# Patient Record
Sex: Male | Born: 1945 | Race: Black or African American | Hispanic: No | Marital: Married | Smoking: Never smoker
Health system: Southern US, Community
[De-identification: ages and names within clinical notes are randomized; demographics above are authoritative.]

## PROBLEM LIST (undated history)

## (undated) DIAGNOSIS — N186 End stage renal disease: Secondary | ICD-10-CM

## (undated) DIAGNOSIS — E119 Type 2 diabetes mellitus without complications: Secondary | ICD-10-CM

## (undated) DIAGNOSIS — E785 Hyperlipidemia, unspecified: Secondary | ICD-10-CM

## (undated) DIAGNOSIS — G4730 Sleep apnea, unspecified: Secondary | ICD-10-CM

## (undated) DIAGNOSIS — M1990 Unspecified osteoarthritis, unspecified site: Secondary | ICD-10-CM

## (undated) DIAGNOSIS — I2510 Atherosclerotic heart disease of native coronary artery without angina pectoris: Secondary | ICD-10-CM

## (undated) DIAGNOSIS — I10 Essential (primary) hypertension: Secondary | ICD-10-CM

## (undated) DIAGNOSIS — Z951 Presence of aortocoronary bypass graft: Secondary | ICD-10-CM

## (undated) HISTORY — DX: Sleep apnea, unspecified: G47.30

## (undated) HISTORY — DX: Unspecified osteoarthritis, unspecified site: M19.90

## (undated) HISTORY — PX: OTHER SURGICAL HISTORY: SHX169

---

## 1993-05-27 HISTORY — PX: ROTATOR CUFF REPAIR: SHX139

## 2003-05-28 HISTORY — PX: PROSTATECTOMY: SHX69

## 2007-03-30 DIAGNOSIS — I2510 Atherosclerotic heart disease of native coronary artery without angina pectoris: Secondary | ICD-10-CM

## 2016-05-27 HISTORY — PX: CORONARY ARTERY BYPASS GRAFT: SHX141

## 2017-05-06 DIAGNOSIS — Z951 Presence of aortocoronary bypass graft: Secondary | ICD-10-CM

## 2018-07-21 ENCOUNTER — Encounter (HOSPITAL_COMMUNITY): Payer: Self-pay | Admitting: Internal Medicine

## 2018-07-21 ENCOUNTER — Observation Stay (HOSPITAL_COMMUNITY)
Admission: EM | Admit: 2018-07-21 | Discharge: 2018-07-22 | Disposition: A | Discharge status: Home / Self Care | Payer: Medicare Other | Attending: Internal Medicine | Admitting: Internal Medicine

## 2018-07-21 ENCOUNTER — Emergency Department (HOSPITAL_COMMUNITY): Payer: Medicare Other

## 2018-07-21 ENCOUNTER — Other Ambulatory Visit: Payer: Self-pay

## 2018-07-21 DIAGNOSIS — R079 Chest pain, unspecified: Secondary | ICD-10-CM

## 2018-07-21 DIAGNOSIS — Z79899 Other long term (current) drug therapy: Secondary | ICD-10-CM | POA: Diagnosis not present

## 2018-07-21 DIAGNOSIS — R0789 Other chest pain: Principal | ICD-10-CM | POA: Insufficient documentation

## 2018-07-21 DIAGNOSIS — Z7982 Long term (current) use of aspirin: Secondary | ICD-10-CM | POA: Diagnosis not present

## 2018-07-21 DIAGNOSIS — I120 Hypertensive chronic kidney disease with stage 5 chronic kidney disease or end stage renal disease: Secondary | ICD-10-CM | POA: Insufficient documentation

## 2018-07-21 DIAGNOSIS — E785 Hyperlipidemia, unspecified: Secondary | ICD-10-CM | POA: Insufficient documentation

## 2018-07-21 DIAGNOSIS — Z951 Presence of aortocoronary bypass graft: Secondary | ICD-10-CM | POA: Diagnosis not present

## 2018-07-21 DIAGNOSIS — Z794 Long term (current) use of insulin: Secondary | ICD-10-CM | POA: Diagnosis not present

## 2018-07-21 DIAGNOSIS — E119 Type 2 diabetes mellitus without complications: Secondary | ICD-10-CM | POA: Diagnosis not present

## 2018-07-21 DIAGNOSIS — I2510 Atherosclerotic heart disease of native coronary artery without angina pectoris: Secondary | ICD-10-CM | POA: Diagnosis not present

## 2018-07-21 DIAGNOSIS — N186 End stage renal disease: Secondary | ICD-10-CM | POA: Insufficient documentation

## 2018-07-21 DIAGNOSIS — Z992 Dependence on renal dialysis: Secondary | ICD-10-CM | POA: Diagnosis not present

## 2018-07-21 DIAGNOSIS — I10 Essential (primary) hypertension: Secondary | ICD-10-CM

## 2018-07-21 HISTORY — DX: End stage renal disease: N18.6

## 2018-07-21 HISTORY — DX: Presence of aortocoronary bypass graft: Z95.1

## 2018-07-21 HISTORY — DX: Atherosclerotic heart disease of native coronary artery without angina pectoris: I25.10

## 2018-07-21 HISTORY — DX: Essential (primary) hypertension: I10

## 2018-07-21 HISTORY — DX: Hyperlipidemia, unspecified: E78.5

## 2018-07-21 HISTORY — DX: Type 2 diabetes mellitus without complications: E11.9

## 2018-07-21 LAB — BASIC METABOLIC PANEL
Anion gap: 8 (ref 5–15)
BUN: 31 mg/dL — ABNORMAL HIGH (ref 8–23)
CO2: 27 mmol/L (ref 22–32)
Calcium: 9.1 mg/dL (ref 8.9–10.3)
Chloride: 103 mmol/L (ref 98–111)
Creatinine, Ser: 6.09 mg/dL — ABNORMAL HIGH (ref 0.61–1.24)
GFR calc Af Amer: 10 mL/min — ABNORMAL LOW (ref >60)
GFR calc non Af Amer: 8 mL/min — ABNORMAL LOW (ref >60)
Glucose, Bld: 101 mg/dL — ABNORMAL HIGH (ref 70–99)
Potassium: 4.4 mmol/L (ref 3.5–5.1)
Sodium: 138 mmol/L (ref 135–145)

## 2018-07-21 LAB — CBC
HCT: 36 % — ABNORMAL LOW (ref 39.0–52.0)
Hemoglobin: 11.2 g/dL — ABNORMAL LOW (ref 13.0–17.0)
MCH: 29.2 pg (ref 26.0–34.0)
MCHC: 31.1 g/dL (ref 30.0–36.0)
MCV: 94 fL (ref 80.0–100.0)
Platelets: 171 K/uL (ref 150–400)
RBC: 3.83 MIL/uL — ABNORMAL LOW (ref 4.22–5.81)
RDW: 14.7 % (ref 11.5–15.5)
WBC: 11.9 K/uL — ABNORMAL HIGH (ref 4.0–10.5)
nRBC: 0 % (ref 0.0–0.2)

## 2018-07-21 LAB — TROPONIN I: Troponin I: <0.03 ng/mL (ref <0.03)

## 2018-07-21 LAB — I-STAT TROPONIN, ED: Troponin i, poc: 0.03 ng/mL (ref 0.00–0.08)

## 2018-07-21 LAB — GLUCOSE, CAPILLARY: Glucose-Capillary: 115 mg/dL — ABNORMAL HIGH (ref 70–99)

## 2018-07-21 MED ORDER — INSULIN ASPART 100 UNIT/ML ~~LOC~~ SOLN: 0.0000 [IU] | Freq: Every day | SUBCUTANEOUS | Status: DC

## 2018-07-21 MED ORDER — ASPIRIN 81 MG PO CHEW
81.0000 mg | CHEWABLE_TABLET | Freq: Every day | ORAL | Status: DC
  Administered 2018-07-22: 81 mg via ORAL
  Filled 2018-07-21: qty 1

## 2018-07-21 MED ORDER — INSULIN ASPART 100 UNIT/ML ~~LOC~~ SOLN
0.0000 [IU] | Freq: Three times a day (TID) | SUBCUTANEOUS | Status: DC
  Administered 2018-07-22: 8 [IU] via SUBCUTANEOUS

## 2018-07-21 MED ORDER — CLOPIDOGREL BISULFATE 75 MG PO TABS
75.0000 mg | ORAL_TABLET | Freq: Every day | ORAL | Status: DC
  Administered 2018-07-22: 75 mg via ORAL
  Filled 2018-07-21: qty 1

## 2018-07-21 MED ORDER — ALUM & MAG HYDROXIDE-SIMETH 200-200-20 MG/5ML PO SUSP: 30.0000 mL | Freq: Once | ORAL | Status: DC

## 2018-07-21 MED ORDER — VITAMIN D (ERGOCALCIFEROL) 1.25 MG (50000 UNIT) PO CAPS: 50000.0000 [IU] | ORAL_CAPSULE | ORAL | Status: DC

## 2018-07-21 MED ORDER — LIDOCAINE VISCOUS HCL 2 % MT SOLN: 15.0000 mL | Freq: Once | OROMUCOSAL | Status: DC

## 2018-07-21 MED ORDER — ONDANSETRON HCL 4 MG/2ML IJ SOLN: 4.0000 mg | Freq: Four times a day (QID) | INTRAMUSCULAR | Status: DC | PRN

## 2018-07-21 MED ORDER — HEPARIN SODIUM (PORCINE) 5000 UNIT/ML IJ SOLN
5000.0000 [IU] | Freq: Three times a day (TID) | INTRAMUSCULAR | Status: DC
  Administered 2018-07-21 – 2018-07-22 (×2): 5000 [IU] via SUBCUTANEOUS
  Filled 2018-07-21 (×2): qty 1

## 2018-07-21 MED ORDER — AMLODIPINE BESYLATE 5 MG PO TABS
5.0000 mg | ORAL_TABLET | Freq: Every day | ORAL | Status: DC
  Administered 2018-07-22: 5 mg via ORAL
  Filled 2018-07-21: qty 1

## 2018-07-21 MED ORDER — LIDOCAINE-PRILOCAINE 2.5-2.5 % EX CREA: 1.0000 "application " | TOPICAL_CREAM | CUTANEOUS | Status: DC | PRN

## 2018-07-21 MED ORDER — ADULT MULTIVITAMIN W/MINERALS CH
1.0000 | ORAL_TABLET | Freq: Every day | ORAL | Status: DC
  Administered 2018-07-22: 1 via ORAL
  Filled 2018-07-21: qty 1

## 2018-07-21 MED ORDER — ACETAMINOPHEN 325 MG PO TABS: 650.0000 mg | ORAL_TABLET | Freq: Four times a day (QID) | ORAL | Status: DC | PRN

## 2018-07-21 MED ORDER — INSULIN DETEMIR 100 UNIT/ML FLEXPEN: 2.0000 [IU] | PEN_INJECTOR | SUBCUTANEOUS | Status: DC

## 2018-07-21 MED ORDER — METOPROLOL TARTRATE 25 MG PO TABS
25.0000 mg | ORAL_TABLET | Freq: Two times a day (BID) | ORAL | Status: DC
  Administered 2018-07-21 – 2018-07-22 (×2): 25 mg via ORAL
  Filled 2018-07-21 (×2): qty 1

## 2018-07-21 MED ORDER — PANTOPRAZOLE SODIUM 40 MG PO TBEC
40.0000 mg | DELAYED_RELEASE_TABLET | Freq: Every day | ORAL | Status: DC
  Administered 2018-07-22: 40 mg via ORAL
  Filled 2018-07-21: qty 1

## 2018-07-21 MED ORDER — CENTRUM PO CHEW: 1.0000 | CHEWABLE_TABLET | Freq: Every day | ORAL | Status: DC

## 2018-07-21 MED ORDER — INSULIN GLARGINE 100 UNIT/ML ~~LOC~~ SOLN
20.0000 [IU] | Freq: Every day | SUBCUTANEOUS | Status: DC
  Administered 2018-07-21: 20 [IU] via SUBCUTANEOUS
  Filled 2018-07-21: qty 0.2

## 2018-07-21 MED ORDER — INSULIN GLARGINE 100 UNIT/ML SOLOSTAR PEN: 20.0000 [IU] | PEN_INJECTOR | Freq: Every day | SUBCUTANEOUS | Status: DC

## 2018-07-21 MED ORDER — ATORVASTATIN CALCIUM 80 MG PO TABS
80.0000 mg | ORAL_TABLET | Freq: Every day | ORAL | Status: DC
  Administered 2018-07-21: 80 mg via ORAL
  Filled 2018-07-21: qty 1

## 2018-07-21 MED ORDER — ACETAMINOPHEN 325 MG PO TABS: 650.0000 mg | ORAL_TABLET | ORAL | Status: DC | PRN

## 2018-07-21 NOTE — ED Provider Notes (Signed)
[facility] EMERGENCY DEPARTMENT Provider Note   CSN: [id number] Arrival date & time: 07/21/18  1537    History   Chief Complaint Chief Complaint  Patient presents with  . Chest Pain    HPI [place] is a 73 y.o. male with history of CAD (bypass 04/2017), ESRD dialysis (M, W, F), hypertension, diabetes, hyperlipidemia presenting today for chest pain.  Patient reports that him and his wife were driving to [place] from [place] when he had onset of left-sided chest pressure at 1:15 PM.  Patient describes his pain as a burning pressure that has been intermittent since time of onset lasting a few seconds at a time and occurring multiple times a minute.  Patient states that his pain has decreased since time of onset however still persists at time of evaluation.  Patient takes Plavix and 81 mg aspirin daily.  He was brought in by EMS who provided additional 324 of aspirin, no nitro given.  EMS concern for T wave inversions at lead I and aVL.    HPI  Past Medical History:  Diagnosis Date  . CAD (coronary artery disease)   . DM2 (diabetes mellitus, type 2) ([facility])   . ESRD (end stage renal disease) ([facility])   . HLD (hyperlipidemia)   . HTN (hypertension)   . Hx of CABG     Patient Active Problem List   Diagnosis Date Noted  . HTN (hypertension) 07/21/2018  . ESRD (end stage renal disease) ([facility]) 07/21/2018  . DM type 2 (diabetes mellitus, type 2) ([facility]) 07/21/2018  . Hyperlipidemia 07/21/2018  . Chest pain 07/21/2018  . 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 Coronary artery disease involving native coronary artery of native heart without angina pectoris 03/30/2007    Past Surgical History:  Procedure Laterality Date  . CORONARY ARTERY BYPASS GRAFT  2018  . PROSTATECTOMY  2005  . ROTATOR CUFF REPAIR Left 1995        Home Medications    Prior to Admission medications   Medication Sig Start Date End Date Taking? Authorizing Provider  acetaminophen (TYLENOL) 325  MG tablet Take 650 mg by mouth every 6 (six) hours as needed for pain. 05/13/17  Yes [provider]  amLODipine (NORVASC) 5 MG tablet Take 5 mg by mouth daily.   Yes [provider]  aspirin 81 MG chewable tablet Chew 81 mg by mouth daily.   Yes [provider]  atorvastatin (LIPITOR) 80 MG tablet Take 80 mg by mouth at bedtime.   Yes [provider]  clopidogrel (PLAVIX) 75 MG tablet Take 75 mg by mouth daily.   Yes [provider]  Insulin Detemir (LEVEMIR FLEXTOUCH) 100 UNIT/ML Pen Inject 2-7 Units into the skin See admin instructions. Sliding scale.   Yes [provider]  Insulin Glargine (LANTUS SOLOSTAR) 100 UNIT/ML Solostar Pen Inject 20 Units into the skin at bedtime.   Yes [provider]  lidocaine-prilocaine (EMLA) cream Apply 1 application topically as needed (dialysis).   Yes [provider]  metoprolol tartrate (LOPRESSOR) 25 MG tablet Take 25 mg by mouth 2 (two) times daily.   Yes [provider]  multivitamin-iron-minerals-folic acid (CENTRUM) chewable tablet Chew 1 tablet by mouth daily.   Yes [provider]  pantoprazole (PROTONIX) 40 MG tablet Take 40 mg by mouth daily.   Yes [provider]  ergocalciferol (VITAMIN D2) 1.25 MG (50000 UT) capsule Take 50,000 Units by mouth once a week.    [provider]    Family History No family history on file.  Social History Social History   Tobacco Use  . Smoking status: Never Smoker  . Smokeless tobacco: Never Used  Substance Use Topics  . Alcohol use: Never    Frequency: Never  . Drug use: Never     Allergies   Pork-derived products; Tomato; and Penicillins   Review of Systems Review of Systems  Constitutional: Negative.  Negative for chills and fever.  Respiratory: Negative.  Negative for shortness of breath.   Cardiovascular: Positive for chest pain. Negative for leg swelling.  Gastrointestinal: Negative.   Negative for abdominal pain, nausea and vomiting.  Musculoskeletal: Negative.  Negative for back pain and neck pain.  Neurological: Negative.  Negative for dizziness, syncope, weakness and headaches.  All other systems reviewed and are negative.  Physical Exam Updated Vital Signs BP (!) 140/59   Pulse 71   Temp 98.5 F (36.9 C) (Oral)   Resp 12   Ht 5\' 8"  (1.727 m)   Wt 108.9 kg   SpO2 95%   BMI 36.49 kg/m   Physical Exam Constitutional:      General: He is not in acute distress.    Appearance: Normal appearance. He is well-developed. He is obese. He is not ill-appearing or diaphoretic.  HENT:     Head: Normocephalic and atraumatic.     Right Ear: External ear normal.     Left Ear: External ear normal.     Nose: Nose normal.  Eyes:     General: Vision grossly intact. Gaze aligned appropriately.     Pupils: Pupils are equal, round, and reactive to light.  Neck:     Musculoskeletal: Normal range of motion.     Trachea: Trachea and phonation normal. No tracheal deviation.  Cardiovascular:     Rate and Rhythm: Normal rate and regular rhythm.     Pulses:          Dorsalis pedis pulses are 1+ on the right side and 1+ on the left side.       Posterior tibial pulses are 1+ on the right side and 1+ on the left side.     Heart sounds: Normal heart sounds.  Pulmonary:     Effort: Pulmonary effort is normal. No respiratory distress.     Breath sounds: Normal breath sounds.  Chest:     Chest wall: No deformity, tenderness or crepitus.  Abdominal:     General: There is no distension.     Palpations: Abdomen is soft.     Tenderness: There is no abdominal tenderness. There is no guarding or rebound.  Musculoskeletal: Normal range of motion.     Right lower leg: He exhibits no tenderness.     Left lower leg: He exhibits no tenderness.  Skin:    General: Skin is warm and dry.     Capillary Refill: Capillary refill takes less than 2 seconds.  Neurological:     Mental Status: He is  alert.     GCS: GCS eye subscore is 4. GCS verbal subscore is 5. GCS motor subscore is 6.     Comments: Speech is clear and goal oriented, follows commands Major Cranial nerves without deficit, no facial droop Moves extremities without ataxia, coordination intact  Psychiatric:        Behavior: Behavior normal.    ED Treatments / Results  Labs (all labs ordered are listed, but only abnormal results are displayed) Labs Reviewed  BASIC METABOLIC PANEL -  Abnormal; Notable for the following components:      Result Value   Glucose, Bld 101 (*)    BUN 31 (*)    Creatinine, Ser 6.09 (*)    GFR calc non Af Amer 8 (*)    GFR calc Af Amer 10 (*)    All other components within normal limits  CBC - Abnormal; Notable for the following components:   WBC 11.9 (*)    RBC 3.83 (*)    Hemoglobin 11.2 (*)    HCT 36.0 (*)    All other components within normal limits  TROPONIN I  TROPONIN I  BASIC METABOLIC PANEL  PHOSPHORUS  I-STAT TROPONIN, ED    EKG EKG Interpretation  Date/Time:  Tuesday July 21 2018 15:47:46 EST Ventricular Rate:  66 PR Interval:    QRS Duration: 91 QT Interval:  377 QTC Calculation: 395 R Axis:   51 Text Interpretation:  Sinus rhythm Abnormal T, consider ischemia, lateral leads downsloping st segments in I, avL and V6 No old tracing to compare Confirmed by Deno Etienne [phone]) on 07/21/2018 3:49:50 PM   Radiology Dg Chest Port 1 View  Result Date: 07/21/2018 CLINICAL DATA:  Initial evaluation for acute chest pain. EXAM: PORTABLE CHEST 1 VIEW COMPARISON:  None available. FINDINGS: Median sternotomy wires underlying surgical clips. Coronary stent noted. Underlying cardiomegaly. Mediastinal silhouette normal. Lungs normally inflated. Blunting of the left costophrenic angle could reflect pleural effusion versus chronic pleural reaction/scarring. No focal infiltrates. No pulmonary edema. No pneumothorax. No acute osseous finding. IMPRESSION: 1. Blunting of the left  costophrenic angle, which could reflect small left pleural effusion versus chronic pleural reaction/scarring. 2. No other active cardiopulmonary disease. 3. Cardiomegaly without pulmonary edema. Electronically Signed   By: Jeannine Boga M.D.   On: 07/21/2018 16:37    Procedures Procedures (including critical care time)  Medications Ordered in ED Medications  acetaminophen (TYLENOL) tablet 650 mg (has no administration in time range)  aspirin chewable tablet 81 mg (has no administration in time range)  amLODipine (NORVASC) tablet 5 mg (has no administration in time range)  atorvastatin (LIPITOR) tablet 80 mg (has no administration in time range)  metoprolol tartrate (LOPRESSOR) tablet 25 mg (has no administration in time range)  Insulin Detemir (LEVEMIR) FlexPen 2-7 Units (has no administration in time range)  Insulin Glargine (LANTUS) Solostar Pen 20 Units (has no administration in time range)  pantoprazole (PROTONIX) EC tablet 40 mg (has no administration in time range)  clopidogrel (PLAVIX) tablet 75 mg (has no administration in time range)  ergocalciferol (VITAMIN D2) capsule 50,000 Units (has no administration in time range)  multivitamin-iron-minerals-folic acid (CENTRUM) chewable tablet 1 tablet (has no administration in time range)  lidocaine-prilocaine (EMLA) cream 1 application (has no administration in time range)  acetaminophen (TYLENOL) tablet 650 mg (has no administration in time range)  ondansetron (ZOFRAN) injection 4 mg (has no administration in time range)  heparin injection 5,000 Units (has no administration in time range)  alum & mag hydroxide-simeth (MAALOX/MYLANTA) 200-200-20 MG/5ML suspension 30 mL (has no administration in time range)    And  lidocaine (XYLOCAINE) 2 % viscous mouth solution 15 mL (has no administration in time range)  insulin aspart (novoLOG) injection 0-15 Units (has no administration in time range)  insulin aspart (novoLOG) injection 0-5  Units (has no administration in time range)     Initial Impression / Assessment and Plan / ED Course  I have reviewed the triage vital signs and the nursing notes.  Pertinent labs & imaging results that were available during my care of the patient were reviewed by me and considered in my medical decision making (see chart for details).  Clinical Course as of Jul 21 2008  Tue Jul 21, 2018  1708 Discussed with hospitalist; to see patient.   [BM]    Clinical Course User Index [BM] Deliah Boston, PA-C      73 year old male with multiple comorbidities presenting today for chest pain.  Patient is on Plavix daily and has received aspirin prior to arrival.  Initial troponin negative CBC with leukocytosis of 11.9 BMP with elevated creatinine, dialysis patient EKG as above reviewed with Dr. Tyrone Nine Chest x-ray:  IMPRESSION: 1. Blunting of the left costophrenic angle, which could reflect small left pleural effusion versus chronic pleural reaction/scarring. 2. No other active cardiopulmonary disease. 3. Cardiomegaly without pulmonary edema. - Patient seen and evaluated by Dr. Tyrone Nine.  Plan at this time is to admit for cardiac rule out. - Patient reevaluated states understanding of care plan and is agreeable for admission.  He is well-appearing and in no acute distress states improvement of his pain since onset. - Patient has been admitted to hospital service for further evaluation and management.  Note: Portions of this report may have been transcribed using voice recognition software. Every effort was made to ensure accuracy; however, inadvertent computerized transcription errors may still be present. Final Clinical Impressions(s) / ED Diagnoses   Final diagnoses:  Chest pain, unspecified type    ED Discharge Orders    None       Gari Crown 07/21/18 2011    Floyd, Dan, DO 07/21/18 2247

## 2018-07-21 NOTE — H&P (Signed)
History and Physical    William Cochran [id number] DOB: [birth year] DOA: 07/21/2018  PCP: William Pain, FNP Consultants: None Patient coming from: Home- lives with wife and son in [place].  Chief Complaint: Chest Cochran  HPI: William Cochran is a 73 y.o. male with medical history significant for CAD status post CABG in 2018 in [place] (Dr. Amanda Pea, MD was cardiologist, Dr. Justice Cochran cardiovascular surgeon), hypertension, hyperlipidemia, diabetes mellitus type 2, insulin-dependent ESRD on HD MWF who presented to the ED today with c/o chest Cochran that occurred while he was driving from his home in [place] to his daughter's nephrology appointment in [place] earlier today.  Around 115 he had the acute onset of 4 out of 10 left-sided chest Cochran.  He describes the Cochran as dull and aggravating, no radiation, no associated shortness of breath diaphoresis or dizziness.  He states that he has this Cochran fairly regularly about once or twice a month and episodes usually last a few seconds before they go away.  Today he has had these episodes about every 30 minutes.  When asked why he came into the ED he stated it is because his wife made him.  Otherwise he is felt well except for some nausea and posttussive emesis this morning.  He has had a cold for about a week with some sinus congestion and cough productive of white sputum.  He states that his symptoms are improving.  He is had no fevers or chills.  No other flulike symptoms.  Chest Cochran is nonpleuritic.  He is a never smoker.  He does not drink alcohol.  Patient has not seen a cardiologist since his CABG in 2018.  He states that he went in to have a stent placement and during the cath was told there was too much disease and he would have to have bypass surgery.  He has longstanding insulin-dependent diabetes.  His PCP is [place], Design[employer], [employer]ellery.  He goes to [facility], dialyzes Monday Wednesday Friday  via left upper extremity AV fistula.  His last dialysis was yesterday.  ED Course: Initial troponin was negative.  Creatinine was 6.9 (ESRD), Otherwise labs were unremarkable.  Chest x-ray showed nothing acute.   EKG showed some downsloping ST segments in leads I, aVL and 6.  There is no old EKG to compare.  Review of Systems: As per HPI; otherwise review of systems reviewed and negative.   Ambulatory Status:  Ambulates without assistance  Past Medical History:  Diagnosis Date  . CAD (coronary artery disease)   . DM2 (diabetes mellitus, type 2) ([place])   . ESRD (end stage renal disease) ([facility])   . HLD (hyperlipidemia)   . HTN (hypertension)   . Hx of CABG     Past Surgical History:  Procedure Laterality Date  . CORONARY ARTERY BYPASS GRAFT  2018  . PROSTATECTOMY  2005  . ROTATOR CUFF REPAIR Left 1995    Social History   Socioeconomic History  . Marital status: Married    Spouse name: [place]  . Number of children: 4  . Years of education: Not on file  . Highest education level: Not on file  Occupational History  . Occupation: retired    Comment: Building control surveyor  Social Needs  . Financial resource strain: Not on file  . Food insecurity:    Worry: Not on file    Inability: Not on file  . Transportation needs:    Medical: Not on file  Non-medical: Not on file  Tobacco Use  . Smoking status: Never Smoker  . Smokeless tobacco: Never Used  Substance and Sexual Activity  . Alcohol use: Never    Frequency: Never  . Drug use: Never  . Sexual activity: Yes    Partners: Female  Lifestyle  . Physical activity:    Days per week: Not on file    Minutes per session: Not on file  . Stress: Not on file  Relationships  . Social connections:    Talks on phone: Not on file    Gets together: Not on file    Attends religious service: Not on file    Active member of club or organization: Not on file    Attends meetings of clubs or organizations: Not on file     Relationship status: Not on file  . Intimate partner violence:    Fear of current or ex partner: Not on file    Emotionally abused: Not on file    Physically abused: Not on file    Forced sexual activity: Not on file  Other Topics Concern  . Not on file  Social History Narrative  . Not on file    Allergies  Allergen Reactions  . Pork-Derived Products Other (See Comments)    religious beliefs   . Tomato Other (See Comments)    Acid reflux  . Penicillins Rash    Did it involve swelling of the face/tongue/throat, SOB, or low BP? Yes Did it involve sudden or severe rash/hives, skin peeling, or any reaction on the inside of your mouth or nose? Yes Did you need to seek medical attention at a hospital or doctor's office? No When did it last happen?pt was a child If all above answers are "NO", may proceed with cephalosporin use.     No family history on file.  Prior to Admission medications   Medication Sig Start Date End Date Taking? Authorizing Provider  acetaminophen (TYLENOL) 325 MG tablet Take 650 mg by mouth every 6 (six) hours as needed for Cochran. 05/13/17  Yes [provider]  amLODipine (NORVASC) 5 MG tablet Take 5 mg by mouth daily.   Yes [provider]  aspirin 81 MG chewable tablet Chew 81 mg by mouth daily.   Yes [provider]  atorvastatin (LIPITOR) 80 MG tablet Take 80 mg by mouth at bedtime.   Yes [provider]  clopidogrel (PLAVIX) 75 MG tablet Take 75 mg by mouth daily.   Yes [provider]  Insulin Detemir (LEVEMIR FLEXTOUCH) 100 UNIT/ML Pen Inject 2-7 Units into the skin See admin instructions. Sliding scale.   Yes [provider]  Insulin Glargine (LANTUS SOLOSTAR) 100 UNIT/ML Solostar Pen Inject 20 Units into the skin at bedtime.   Yes [provider]  lidocaine-prilocaine (EMLA) cream Apply 1 application topically as needed (dialysis).   Yes [provider]  metoprolol tartrate  (LOPRESSOR) 25 MG tablet Take 25 mg by mouth 2 (two) times daily.   Yes [provider]  multivitamin-iron-minerals-folic acid (CENTRUM) chewable tablet Chew 1 tablet by mouth daily.   Yes [provider]  pantoprazole (PROTONIX) 40 MG tablet Take 40 mg by mouth daily.   Yes [provider]  ergocalciferol (VITAMIN D2) 1.25 MG (50000 UT) capsule Take 50,000 Units by mouth once a week.    [provider]    Physical Exam: Vitals:   07/21/18 1542 07/21/18 1548  BP:  (!) 152/72  Pulse:  67  Resp:  18  Temp:  98.5 F (36.9 C)  TempSrc:  Oral  SpO2:  98%  Weight: 108.9 kg   Height: 5\' 8"  (1.727 m)      . General: Appears calm and comfortable and is in NAD . Eyes:  PERRL, EOMI, normal lids, iris . ENT:  grossly normal hearing, lips & tongue, mmm, dentition in good repair . Neck:  supple, no lymphadenopathy . Cardiovascular:  nL S1, S2, normal rate, reg rhythm, no murmur. Marland Kitchen Respiratory:   CTA bilaterally with no wheezes/rales/rhonchi.  Normal respiratory effort. . Abdomen:  soft, NT, ND, NABS . Back:   grossly normal alignment . Skin:  no rash or lesions seen on limited exam . Musculoskeletal:  grossly normal tone BUE/BLE, good ROM, no bony abnormality or obvious joint deformity . Extremities: Left upper extremity AV fistula with good thrill and bruit.  No LE edema.  Limited foot exam with no ulcerations.  2+ distal pulses. Marland Kitchen Psychiatric:  grossly normal mood and affect, speech fluent and appropriate, AOx3 . Neurologic:  CN 2-12 grossly intact, moves all extremities in coordinated fashion, sensation intact, Patellar DTRs 2+ and symmetric    Radiological Exams on Admission: Dg Chest Port 1 View  Result Date: 07/21/2018 CLINICAL DATA:  Initial evaluation for acute chest Cochran. EXAM: PORTABLE CHEST 1 VIEW COMPARISON:  None available. FINDINGS: Median sternotomy wires underlying surgical clips. Coronary stent noted. Underlying cardiomegaly. Mediastinal  silhouette normal. Lungs normally inflated. Blunting of the left costophrenic angle could reflect pleural effusion versus chronic pleural reaction/scarring. No focal infiltrates. No pulmonary edema. No pneumothorax. No acute osseous finding. IMPRESSION: 1. Blunting of the left costophrenic angle, which could reflect small left pleural effusion versus chronic pleural reaction/scarring. 2. No other active cardiopulmonary disease. 3. Cardiomegaly without pulmonary edema. Electronically Signed   By: Jeannine Boga M.D.   On: 07/21/2018 16:37    EKG: Independently reviewed.  Rate 66 Sinus rhythm Abnormal T, consider ischemia, lateral leads downsloping st segments in I, avL and V6 No old tracing to compare   Labs on Admission: I have personally reviewed the available labs and imaging studies at the time of the admission.  Pertinent labs:  BUN 31 Creat 6.09 K 4.4 Cl 101 CO2 27  TnI 0.03  WBC 11.9 Hgb 11.2 Plt 171   Assessment/Plan Principal Problem:   Chest Cochran Active Problems:   HTN (hypertension)   ESRD (end stage renal disease) ([facility])   DM type 2 (diabetes mellitus, type 2) ([facility])   Hyperlipidemia   CAD (coronary artery disease)   S/P CABG x 2   Chest Cochran, history of CAD status post CABG: -Heart score of 6 - EKG with some nonspecific repolarization abnormalities with no available prior tracings to compare. Initial troponin of 0.03 very reassuring in this patient with end-stage renal disease.  Likely noncardiac chest Cochran.  However, given his history, spoke with cardiology (Dr. Kalman Shan) as patient will need close follow-up after discharge.  He has not seen his primary cardiologist Dr. Sharyon Cable since his CABG in 2018.  He is interested in transferring his care to [facility]. -Admit to observation, cardiac telemetry -[facility] cardiology consultation in the morning -Cycle troponins, EKG in the morning -Continue medical therapy with aspirin, Plavix, Lipitor, metoprolol -If ruled out  for acute coronary syndrome overnight he will need very close outpatient follow-up.  Appreciate cardiology assistance with this.  Hypertension: -Continue Norvasc, metoprolol  Insulin-dependent diabetes: Likely uncontrolled.  Last hemoglobin A1c I can find in care everywhere  was April 22, 2017 when it was 14.9. -Continue Lantus 20 units at bedtime -Heart healthy carb controlled diet -Sliding scale insulin -Check hemoglobin A1c in the morning  Hyperlipidemia: -Continue atorvastatin -Fasting lipid panel in the morning  End-stage renal disease on hemodialysis Monday Wednesday Friday -Sent text paged nephrology informing them of patient's status.  He is due for dialysis tomorrow but would like to be discharged in time to make it to his home dialysis center by 10 AM.  I told him we would try to make this happen but he may end up needing dialysis in-house tomorrow.   DVT prophylaxis: Heparin subQ Code Status:  Full - confirmed with patient/family Family Communication: Wife at bedside Disposition Plan:  Home once clinically improved Consults called: Cardiology Admission status: It is my clinical opinion that referral for OBSERVATION is reasonable and necessary in this patient based on the above information provided. The aforementioned taken together are felt to place the patient at high risk for further clinical deterioration. However it is anticipated that the patient may be medically stable for discharge from the hospital within 24 to 48 hours.     Janora Norlander MD [facility]  If note is complete, please contact covering daytime or nighttime physician. [email or web address] Password [facility]  07/21/2018, 6:23 PM

## 2018-07-21 NOTE — ED Triage Notes (Signed)
Pt c/o chest pressure episode today that lasted around 45 minutes  ; states " it feels like i'm having indigestion " pt received 324 of asa by ems ; pt denies any further chest pressure , ems reports ekg showed t wave inversions in high lateral leads ; pt goes to HD on M,W,F ; pt states he went yesterday

## 2018-07-21 NOTE — Plan of Care (Signed)
  Problem: Education: Goal: Understanding of CV disease, CV risk reduction, and recovery process will improve Outcome: Progressing Goal: Individualized Educational Video(s) Outcome: Progressing   Problem: Activity: Goal: Ability to return to baseline activity level will improve Outcome: Progressing   

## 2018-07-21 NOTE — ED Notes (Signed)
Sps sent to Pt's Rm

## 2018-07-22 ENCOUNTER — Encounter (HOSPITAL_COMMUNITY): Payer: Self-pay | Admitting: Student

## 2018-07-22 DIAGNOSIS — I2510 Atherosclerotic heart disease of native coronary artery without angina pectoris: Secondary | ICD-10-CM

## 2018-07-22 DIAGNOSIS — E119 Type 2 diabetes mellitus without complications: Secondary | ICD-10-CM | POA: Diagnosis not present

## 2018-07-22 DIAGNOSIS — R072 Precordial pain: Secondary | ICD-10-CM

## 2018-07-22 DIAGNOSIS — Z992 Dependence on renal dialysis: Secondary | ICD-10-CM | POA: Diagnosis not present

## 2018-07-22 DIAGNOSIS — E1122 Type 2 diabetes mellitus with diabetic chronic kidney disease: Secondary | ICD-10-CM | POA: Diagnosis not present

## 2018-07-22 DIAGNOSIS — I10 Essential (primary) hypertension: Secondary | ICD-10-CM

## 2018-07-22 DIAGNOSIS — Z951 Presence of aortocoronary bypass graft: Secondary | ICD-10-CM

## 2018-07-22 DIAGNOSIS — R0789 Other chest pain: Secondary | ICD-10-CM | POA: Diagnosis not present

## 2018-07-22 DIAGNOSIS — I120 Hypertensive chronic kidney disease with stage 5 chronic kidney disease or end stage renal disease: Secondary | ICD-10-CM | POA: Diagnosis not present

## 2018-07-22 DIAGNOSIS — N186 End stage renal disease: Secondary | ICD-10-CM

## 2018-07-22 DIAGNOSIS — Z794 Long term (current) use of insulin: Secondary | ICD-10-CM

## 2018-07-22 LAB — BASIC METABOLIC PANEL
Anion gap: 11 (ref 5–15)
BUN: 35 mg/dL — ABNORMAL HIGH (ref 8–23)
CO2: 27 mmol/L (ref 22–32)
Calcium: 8.9 mg/dL (ref 8.9–10.3)
Chloride: 102 mmol/L (ref 98–111)
Creatinine, Ser: 6.65 mg/dL — ABNORMAL HIGH (ref 0.61–1.24)
GFR calc Af Amer: 9 mL/min — ABNORMAL LOW (ref >60)
GFR calc non Af Amer: 8 mL/min — ABNORMAL LOW (ref >60)
Glucose, Bld: 223 mg/dL — ABNORMAL HIGH (ref 70–99)
Potassium: 5 mmol/L (ref 3.5–5.1)
Sodium: 140 mmol/L (ref 135–145)

## 2018-07-22 LAB — LIPID PANEL
Cholesterol: 102 mg/dL (ref 0–200)
HDL: 30 mg/dL — ABNORMAL LOW (ref >40)
LDL Cholesterol: 45 mg/dL (ref 0–99)
Total CHOL/HDL Ratio: 3.4 RATIO
Triglycerides: 137 mg/dL (ref <150)
VLDL: 27 mg/dL (ref 0–40)

## 2018-07-22 LAB — HEMOGLOBIN A1C
Hgb A1c MFr Bld: 8.7 % — ABNORMAL HIGH (ref 4.8–5.6)
Mean Plasma Glucose: 202.99 mg/dL

## 2018-07-22 LAB — PHOSPHORUS: Phosphorus: 1.6 mg/dL — ABNORMAL LOW (ref 2.5–4.6)

## 2018-07-22 LAB — GLUCOSE, CAPILLARY: Glucose-Capillary: 253 mg/dL — ABNORMAL HIGH (ref 70–99)

## 2018-07-22 LAB — TROPONIN I: Troponin I: <0.03 ng/mL (ref <0.03)

## 2018-07-22 NOTE — Discharge Summary (Signed)
Physician Discharge Summary  William Cochran [id number] DOB: 12-23-1945 DOA: 07/21/2018  PCP: Renee Pain, FNP  Admit date: 07/21/2018 Discharge date: 07/22/2018  Admitted From: home Discharge disposition: home   Recommendations for Outpatient Follow-Up:   1. Resume HD 2. Follow Phos level with HD   Discharge Diagnosis:   Principal Problem:   Chest pain Active Problems:   HTN (hypertension)   ESRD (end stage renal disease) (HCC)   DM type 2 (diabetes mellitus, type 2) (HCC)   Hyperlipidemia   CAD (coronary artery disease)   S/P CABG x 2    Discharge Condition: Improved.  Diet recommendation: Low sodium, heart healthy.  Carbohydrate-modified.  Regular.  Wound care: None.  Code status: Full.   History of Present Illness:   William Cochran is a 73 y.o. male with a history of CAD s/p CABG x2 (LIMA-LAD and SVG-OM1) in 04/2017 in [place], ESRD on hemodialysis (MWF), post-operative atrial fibrillation, obstructive sleep apnea on CPAP, hypertension, hyperlipidemia, type 2 diabetes mellitus, and severe pulmonary hypertension   Hospital Course by Problem:   Chest pain -per cards: Key new complaints: Chest pain is atypical and is different from his previous angina Key examination changes: Normal cardiovascular exam, excellent bruit and thrill over left forearm AV fistula. Key new findings / data: ECG shows sinus rhythm and changes of LVH, including inferolateral ST-T changes, no old tracing to compare. PLAN: He is on appropriate medical therapy for chronic coronary artery disease including dual antiplatelet therapy, high-dose statin, beta blockers.  Blood pressure is well controlled.  No plan for further inpatient cardiac work-up at this time, but it is very important that he reestablish chronic cardiology follow-up.  He would clearly prefer seeing a cardiologist close to home in [place], but we will make him a "backup" follow-up appointment here in  [place] in case he cannot establish cardiology care closer to home.  Hypertension: -Continue Norvasc, metoprolol  Insulin-dependent diabetes: Likely uncontrolled.  Last hemoglobin A1c I can find in care everywhere was April 22, 2017 when it was 14.9. -defer to PCP  Hyperlipidemia: -Continue atorvastatin  End-stage renal disease on hemodialysis Monday Wednesday Friday -patient to do HD today  Medical Consultants:   cards   Discharge Exam:   Vitals:   07/22/18 0546 07/22/18 0744  BP: (!) 165/70 (!) 130/97  Pulse: 71 69  Resp: 17 17  Temp: 98 F (36.7 C) 98 F (36.7 C)  SpO2: 96% 93%   Vitals:   07/21/18 2030 07/21/18 2053 07/22/18 0546 07/22/18 0744  BP: (!) 155/68 (!) 156/68 (!) 165/70 (!) 130/97  Pulse: 71 97 71 69  Resp: 13 17 17 17   Temp:   98 F (36.7 C) 98 F (36.7 C)  TempSrc:  Oral Oral Oral  SpO2: 91% 93% 96% 93%  Weight:  97.7 kg 96 kg   Height:  5\' 8"  (1.727 m)      General exam: Appears calm and comfortable.   The results of significant diagnostics from this hospitalization (including imaging, microbiology, ancillary and laboratory) are listed below for reference.     Procedures and Diagnostic Studies:   Dg Chest Port 1 View  Result Date: 07/21/2018 CLINICAL DATA:  Initial evaluation for acute chest pain. EXAM: PORTABLE CHEST 1 VIEW COMPARISON:  None available. FINDINGS: Median sternotomy wires underlying surgical clips. Coronary stent noted. Underlying cardiomegaly. Mediastinal silhouette normal. Lungs normally inflated. Blunting of the left costophrenic angle could reflect pleural effusion versus chronic pleural reaction/scarring. No focal  infiltrates. No pulmonary edema. No pneumothorax. No acute osseous finding. IMPRESSION: 1. Blunting of the left costophrenic angle, which could reflect small left pleural effusion versus chronic pleural reaction/scarring. 2. No other active cardiopulmonary disease. 3. Cardiomegaly without pulmonary edema.  Electronically Signed   By: Jeannine Boga M.D.   On: 07/21/2018 16:37     Labs:   Basic Metabolic Panel: Recent Labs  Lab 07/21/18 1616 07/21/18 2356  NA 138 140  K 4.4 5.0  CL 103 102  CO2 27 27  GLUCOSE 101* 223*  BUN 31* 35*  CREATININE 6.09* 6.65*  CALCIUM 9.1 8.9  PHOS  --  1.6*   GFR Estimated Creatinine Clearance: 11.4 mL/min (A) (by C-G formula based on SCr of 6.65 mg/dL (H)). Liver Function Tests: No results for input(s): AST, ALT, ALKPHOS, BILITOT, PROT, ALBUMIN in the last 168 hours. No results for input(s): LIPASE, AMYLASE in the last 168 hours. No results for input(s): AMMONIA in the last 168 hours. Coagulation profile No results for input(s): INR, PROTIME in the last 168 hours.  CBC: Recent Labs  Lab 07/21/18 1616  WBC 11.9*  HGB 11.2*  HCT 36.0*  MCV 94.0  PLT 171   Cardiac Enzymes: Recent Labs  Lab 07/21/18 1826 07/21/18 2356  TROPONINI <0.03 <0.03   BNP: Invalid input(s): POCBNP CBG: Recent Labs  Lab 07/21/18 2051 07/22/18 0743  GLUCAP 115* 253*   D-Dimer No results for input(s): DDIMER in the last 72 hours. Hgb A1c Recent Labs    07/21/18 2356  HGBA1C 8.7*   Lipid Profile Recent Labs    07/21/18 2356  CHOL 102  HDL 30*  LDLCALC 45  TRIG 137  CHOLHDL 3.4   Thyroid function studies No results for input(s): TSH, T4TOTAL, T3FREE, THYROIDAB in the last 72 hours.  Invalid input(s): FREET3 Anemia work up No results for input(s): VITAMINB12, FOLATE, FERRITIN, TIBC, IRON, RETICCTPCT in the last 72 hours. Microbiology No results found for this or any previous visit (from the past 240 hour(s)).   Discharge Instructions:   Discharge Instructions    Discharge instructions   Complete by:  As directed    Renal/carb mod diet Resume HD today Resume prior insulin regimen   Increase activity slowly   Complete by:  As directed      Allergies as of 07/22/2018      Reactions   Pork-derived Products Other (See  Comments)   religious beliefs    Tomato Other (See Comments)   Acid reflux   Penicillins Rash   Did it involve swelling of the face/tongue/throat, SOB, or low BP? Yes Did it involve sudden or severe rash/hives, skin peeling, or any reaction on the inside of your mouth or nose? Yes Did you need to seek medical attention at a hospital or doctor's office? No When did it last happen?pt was a child If all above answers are "NO", may proceed with cephalosporin use.      Medication List    STOP taking these medications   LEVEMIR FLEXTOUCH 100 UNIT/ML Pen Generic drug:  Insulin Detemir     TAKE these medications   acetaminophen 325 MG tablet Commonly known as:  TYLENOL Take 650 mg by mouth every 6 (six) hours as needed for pain.   amLODipine 5 MG tablet Commonly known as:  NORVASC Take 5 mg by mouth daily.   aspirin 81 MG chewable tablet Chew 81 mg by mouth daily.   atorvastatin 80 MG tablet Commonly known as:  LIPITOR Take  80 mg by mouth at bedtime.   clopidogrel 75 MG tablet Commonly known as:  PLAVIX Take 75 mg by mouth daily.   ergocalciferol 1.25 MG (50000 UT) capsule Commonly known as:  VITAMIN D2 Take 50,000 Units by mouth once a week.   LANTUS SOLOSTAR 100 UNIT/ML Solostar Pen Generic drug:  Insulin Glargine Inject 20 Units into the skin at bedtime.   lidocaine-prilocaine cream Commonly known as:  EMLA Apply 1 application topically as needed (dialysis).   metoprolol tartrate 25 MG tablet Commonly known as:  LOPRESSOR Take 25 mg by mouth 2 (two) times daily.   multivitamin-iron-minerals-folic acid chewable tablet Chew 1 tablet by mouth daily.   pantoprazole 40 MG tablet Commonly known as:  PROTONIX Take 40 mg by mouth daily.      Follow-up Information    Plunk, Enid Derry, FNP Follow up in 1 week(s).   Specialty:  Nurse Practitioner Contact information: [address] [place] [address] [phone]            Time  coordinating discharge: 25 min  Signed:  Geradine Girt DO  [facility] 07/22/2018, 10:11 AM

## 2018-07-22 NOTE — Progress Notes (Addendum)
Cardiology Consult    Patient ID: Sadrac Zeoli MRN: [id number], DOB/AGE: [birth year]   Admit date: 07/21/2018 Date of Consult: 07/22/2018  Primary Physician: Renee Pain, Noel Primary Cardiologist: No primary care provider on file. Requesting Provider: Royal Piedra, MD  Patient Profile    Eldon Zietlow is a 73 y.o. male with a history of CAD s/p CABG x2 (LIMA-LAD and SVG-OM1) in 04/2017 in [place], ESRD on hemodialysis (MWF), post-operative atrial fibrillation, obstructive sleep apnea on CPAP, hypertension, hyperlipidemia, type 2 diabetes mellitus, and severe pulmonary hypertension, who is being seen today for the evaluation of chest pain at the request of Dr. Steffanie Dunn.  History of Present Illness    Mr. Sotomayor is a 73 year old male with the above history who has been previously seen by a Film/video editor in [place]. Patient has not seen a Cardiologist since his CABG in 04/2017. Patient was driving from [place] to [place] yesterday to attend his daughter's Nephrology appointment when he developed non-radiating left sided chest pain that he describes as "sticking" and "pressure." Pain intermittent and would last for a few seconds each time before resolving. This persisted for several hours but the intensity of the pain lessened over that time. Patient denies any pleuritic pain. No associated diaphoresis, shortness of breath, palpitations, lightheadedness, or dizziness. He did have an episode of vomiting earlier yesterday morning but denies any nausea/vomiting with the pain. He denies any other recent chest pain even with exertion. He has some exertional shortness of breath but states this has not worsened recently. No orthopnea, PND, or edema. He has had a productive cough recently but denies any recent fevers or illnesses. When patient arrived at his daughter's appointment, they had him transported to the ED via EMS.  In the ED, EKG showed sinus rhythm with T wave inversions noted in  lateral leads. I-stat troponin negative. Chest x-ray showed blunting of the left costophrenic angle which could reflect small left pleural effusion verses chronic pleural reaction/scarring). WBC 11.9, Hgb 11.2, Plts 171. Na 138, K 4.4, Glucose 101, SCr 6.09. Patient was admitted for further evaluation.  Currently, patient is chest pain free and denies any further pain since yesterday. Patient is very eager to go back home to [place] for his dialysis session.  Patient denies any history of tobacco or alcohol use. Patient has no known family of heart disease but his mother, brother, and sister all have diabetes.   Past Medical History   Past Medical History:  Diagnosis Date  . CAD (coronary artery disease)   . DM2 (diabetes mellitus, type 2) ([place])   . ESRD (end stage renal disease) ([place])   . HLD (hyperlipidemia)   . HTN (hypertension)   . Hx of CABG     Past Surgical History:  Procedure Laterality Date  . CORONARY ARTERY BYPASS GRAFT  2018  . PROSTATECTOMY  2005  . ROTATOR CUFF REPAIR Left 1995     Allergies  Allergies  Allergen Reactions  . Pork-Derived Products Other (See Comments)    religious beliefs   . Tomato Other (See Comments)    Acid reflux  . Penicillins Rash    Did it involve swelling of the face/tongue/throat, SOB, or low BP? Yes Did it involve sudden or severe rash/hives, skin peeling, or any reaction on the inside of your mouth or nose? Yes Did you need to seek medical attention at a hospital or doctor's office? No When did it last happen?pt was a child If all above answers are "NO",  may proceed with cephalosporin use.     Inpatient Medications    . alum & mag hydroxide-simeth  30 mL Oral Once   And  . lidocaine  15 mL Oral Once  . amLODipine  5 mg Oral Daily  . aspirin  81 mg Oral Daily  . atorvastatin  80 mg Oral QHS  . clopidogrel  75 mg Oral Daily  . heparin  5,000 Units Subcutaneous Q8H  . insulin aspart  0-15 Units Subcutaneous TID WC  .  insulin aspart  0-5 Units Subcutaneous QHS  . insulin glargine  20 Units Subcutaneous QHS  . metoprolol tartrate  25 mg Oral BID  . multivitamin with minerals  1 tablet Oral Daily  . pantoprazole  40 mg Oral Daily  . [START ON 07/25/2018] Vitamin D (Ergocalciferol)  50,000 Units Oral Q Sat    Family History    Family History  Problem Relation Age of Onset  . Diabetes Mother   . Diabetes Sister   . Diabetes Brother   . Heart disease Neg Hx    He indicated that the status of his mother is unknown. He indicated that the status of his sister is unknown. He indicated that the status of his brother is unknown. He indicated that the status of his neg hx is unknown.   Social History    Social History   Socioeconomic History  . Marital status: Married    Spouse name: Tonio Seider  . Number of children: 4  . Years of education: Not on file  . Highest education level: Not on file  Occupational History  . Occupation: retired    Comment: Building control surveyor  Social Needs  . Financial resource strain: Not on file  . Food insecurity:    Worry: Not on file    Inability: Not on file  . Transportation needs:    Medical: Not on file    Non-medical: Not on file  Tobacco Use  . Smoking status: Never Smoker  . Smokeless tobacco: Never Used  Substance and Sexual Activity  . Alcohol use: Never    Frequency: Never  . Drug use: Never  . Sexual activity: Yes    Partners: Female  Lifestyle  . Physical activity:    Days per week: Not on file    Minutes per session: Not on file  . Stress: Not on file  Relationships  . Social connections:    Talks on phone: Not on file    Gets together: Not on file    Attends religious service: Not on file    Active member of club or organization: Not on file    Attends meetings of clubs or organizations: Not on file    Relationship status: Not on file  . Intimate partner violence:    Fear of current or ex partner: Not on file    Emotionally  abused: Not on file    Physically abused: Not on file    Forced sexual activity: Not on file  Other Topics Concern  . Not on file  Social History Narrative  . Not on file     Review of Systems    Review of Systems  Constitutional: Negative for chills, diaphoresis and fever.  HENT: Negative for congestion.   Respiratory: Positive for cough, sputum production and shortness of breath. Negative for hemoptysis.   Cardiovascular: Positive for chest pain. Negative for palpitations, orthopnea, leg swelling and PND.  Gastrointestinal: Positive for vomiting. Negative for blood in stool.  Genitourinary: Negative for hematuria.  Musculoskeletal: Negative for myalgias.  Neurological: Negative for dizziness and loss of consciousness.  Endo/Heme/Allergies: Does not bruise/bleed easily.  Psychiatric/Behavioral: Negative for substance abuse.    Physical Exam    Blood pressure (!) 130/97, pulse 69, temperature 98 F (36.7 C), temperature source Oral, resp. rate 17, height 5\' 8"  (1.727 m), weight 96 kg, SpO2 93 %.  General: 73 y.o. African-American male resting comfortably in no acute distress. Pleasant and cooperative. HEENT: Normal  Neck: Supple. No carotid bruits. Mild JVD appreciated. Lungs: No increased work of breathing. Clear to auscultation bilaterally. No wheezes, rhonchi, or rales. Heart: RRR. Distinct S1 and S2. No murmurs, gallops, or rubs.  Abdomen: Soft, non-distended, and non-tender to palpation. Bowel sounds present. Extremities: No lower extremity edema. Posterior tibial pulses 2+ and equal bilaterally. Skin: Warm and dry. Neuro: Alert and oriented x3. No focal deficits. Moves all extremities spontaneously. Psych: Normal affect. Responds appropriately.   Labs    Troponin [facility] Test) Recent Labs    07/21/18 1621  TROPIPOC 0.03   Recent Labs    07/21/18 1826 07/21/18 2356  TROPONINI <0.03 <0.03   Lab Results  Component Value Date   WBC 11.9 (H) 07/21/2018     HGB 11.2 (L) 07/21/2018   HCT 36.0 (L) 07/21/2018   MCV 94.0 07/21/2018   PLT 171 07/21/2018    Recent Labs  Lab 07/21/18 2356  NA 140  K 5.0  CL 102  CO2 27  BUN 35*  CREATININE 6.65*  CALCIUM 8.9  GLUCOSE 223*   Lab Results  Component Value Date   CHOL 102 07/21/2018   HDL 30 (L) 07/21/2018   LDLCALC 45 07/21/2018   TRIG 137 07/21/2018   No results found for: [facility]   Radiology Studies    Dg Chest Port 1 View  Result Date: 07/21/2018 CLINICAL DATA:  Initial evaluation for acute chest pain. EXAM: PORTABLE CHEST 1 VIEW COMPARISON:  None available. FINDINGS: Median sternotomy wires underlying surgical clips. Coronary stent noted. Underlying cardiomegaly. Mediastinal silhouette normal. Lungs normally inflated. Blunting of the left costophrenic angle could reflect pleural effusion versus chronic pleural reaction/scarring. No focal infiltrates. No pulmonary edema. No pneumothorax. No acute osseous finding. IMPRESSION: 1. Blunting of the left costophrenic angle, which could reflect small left pleural effusion versus chronic pleural reaction/scarring. 2. No other active cardiopulmonary disease. 3. Cardiomegaly without pulmonary edema. Electronically Signed   By: Jeannine Boga M.D.   On: 07/21/2018 16:37    EKG     EKG: EKG was personally reviewed and demonstrates: Normal sinus rhythm with T wave inversion in lateral leads.   Telemetry: Telemetry was personally reviewed and demonstrates: Sinus rhythm.  Cardiac Imaging    Echocardiogram 04/23/2017 ([facility]): Summary  1. Overall left ventricular ejection fraction is estimated at 55 to 60%.  2. Normal global left ventricular systolic function.  3. Mid anteroseptal segment is abnormal as described in the body of the report.  4. (Grade 2) Moderately abnormal left ventricular diastolic filling.  5. Severe concentric left ventricular hypertrophy.  6. Mild mitral annular calcification.  7. Mild  mitral valve regurgitation.  8. Elevated left ventricular end diastolic pressure by tissue Doppler.  9. No intracardiac thrombi, mass or vegetations.  Assessment & Plan    Chest Pain with Known CAD s/p CABG - Patient presented for intermittent left sided chest pain at rest that lasted for a few seconds at a time. He does have a family  history of CAD s/p CABG in 04/2017. He has not seen a Cardiologist since that time because he has been being with dialysis.  - EKG showed T wave inversions in lateral leads (no old tracings for comparison) - Troponin negative x3. - Patient is currently chest pain free.  - Continue dual antiplatelet therapy with Aspirin and Plavix. - Continue aggressive secondary prevention with beta-blocker and high-intensity statin. - Continue home Amlodipine for hypertension. - Presentation atypical. Given patient is currently pain free and heart enzymes have been negative, I think it would be reasonable for patient to follow up outpatient with primary Cardiologist. He would to see his previous Cardiologist in [place]. I emphasized the importance of him following up with Cardiology since he has not seen them since 2018.   ESRD on Hemodialysis - Patient on hemodialysis MWF. He is due for dialysis back home today and wants to be discharged so that he can make his dialysis session.   Otherwise, per primary team.    Signed, Darreld Mclean, PA-C 07/22/2018, 10:27 AM  For questions or updates, please contact   Please consult [email or web address] for contact info under Cardiology/STEMI.  I have seen and examined the patient along with Darreld Mclean, PA-C.  I have reviewed the chart, notes and new data.  I agree with PA/NP's note.  Key new complaints: Chest pain is atypical and is different from his previous angina Key examination changes: Normal cardiovascular exam, excellent bruit and thrill over left forearm AV fistula. Key new findings / data: ECG shows sinus rhythm and  changes of LVH, including inferolateral ST-T changes, no old tracing to compare.  PLAN: He is on appropriate medical therapy for chronic coronary artery disease including dual antiplatelet therapy, high-dose statin, beta blockers.  Blood pressure is well controlled.  No plan for further inpatient cardiac work-up at this time, but it is very important that he reestablish chronic cardiology follow-up.  He would clearly prefer seeing a cardiologist close to home in [place], but we will make him a "backup" follow-up appointment here in [place] in case he cannot establish cardiology care closer to home.  Sanda Klein, MD, [place] [phone] 07/22/2018, 10:32 AM

## 2018-07-29 ENCOUNTER — Encounter: Payer: Self-pay | Admitting: Cardiology

## 2018-08-11 ENCOUNTER — Telehealth: Payer: Self-pay | Admitting: Cardiology

## 2018-08-11 NOTE — Telephone Encounter (Signed)
Spoke to Mr. William Cochran on the phone.  We will reschedule his appointment secondary to coronavirus.  He is feeling well.  No further chest pain during hemodialysis.  He appreciated the phone call.  He knows to contact us if he has any worrisome symptoms.  Candee Furbish, MD

## 2018-08-13 ENCOUNTER — Ambulatory Visit: Payer: Medicare Other | Admitting: Cardiology

## 2020-01-27 IMAGING — DX DG CHEST 1V PORT
1 series · 1 of 1 positions shown · non-contrast
Comparison: None available.

CLINICAL DATA: Initial evaluation for acute chest pain.

EXAM:
PORTABLE CHEST 1 VIEW

[chest]
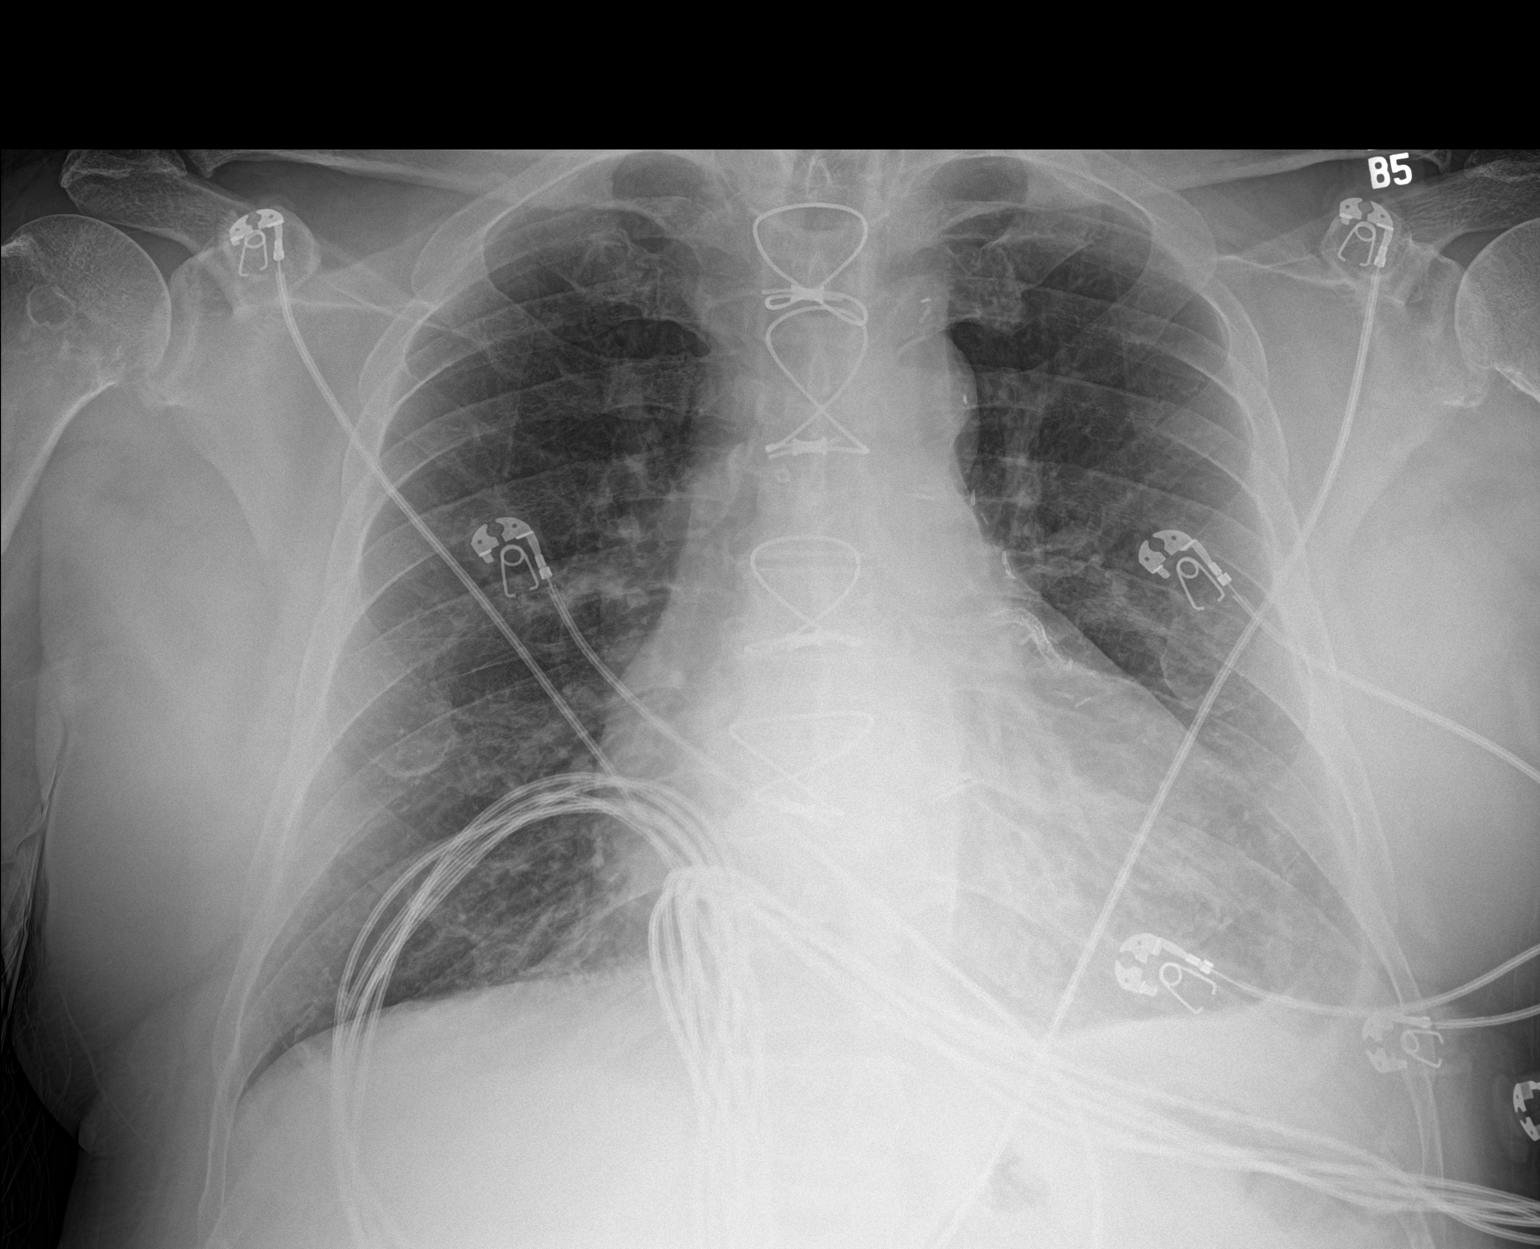

[1 of 1 positions shown; findings below may reference images not displayed]

FINDINGS: Median sternotomy wires underlying surgical clips. Coronary stent
noted. Underlying cardiomegaly. Mediastinal silhouette normal.

Lungs normally inflated. Blunting of the left costophrenic angle
could reflect pleural effusion versus chronic pleural
reaction/scarring. No focal infiltrates. No pulmonary edema. No
pneumothorax.

No acute osseous finding.
IMPRESSION: 1. Blunting of the left costophrenic angle, which could reflect
small left pleural effusion versus chronic pleural
reaction/scarring.
2. No other active cardiopulmonary disease.
3. Cardiomegaly without pulmonary edema.

## 2020-04-26 DEATH — deceased
